# Patient Record
Sex: Male | Born: 1980
Health system: Southern US, Community
[De-identification: ages and names within clinical notes are randomized; demographics above are authoritative.]

## PROBLEM LIST (undated history)

## (undated) HISTORY — PX: KNEE ARTHROSCOPY: SUR90

## (undated) HISTORY — PX: SHOULDER ARTHROSCOPY: SHX128

---

## 2004-08-25 ENCOUNTER — Emergency Department (HOSPITAL_COMMUNITY): Admission: EM | Admit: 2004-08-25 | Discharge: 2004-08-25 | Payer: Self-pay | Admitting: Emergency Medicine

## 2007-08-13 ENCOUNTER — Encounter: Admission: RE | Admit: 2007-08-13 | Discharge: 2007-08-13 | Payer: Self-pay | Admitting: Family Medicine

## 2007-09-03 ENCOUNTER — Ambulatory Visit (HOSPITAL_COMMUNITY): Admission: RE | Admit: 2007-09-03 | Discharge: 2007-09-03 | Payer: Self-pay | Admitting: Gastroenterology

## 2009-06-15 ENCOUNTER — Ambulatory Visit (HOSPITAL_COMMUNITY): Admission: RE | Admit: 2009-06-15 | Discharge: 2009-06-15 | Payer: Self-pay | Admitting: Gastroenterology

## 2010-09-27 NOTE — Op Note (Signed)
NAME:  Gabriel Winters, Gabriel Winters NO.:  [id number]   MEDICAL RECORD NO.:  [id number]          PATIENT TYPE:  AMB   LOCATION:  ENDO                         FACILITY:  [facility]   PHYSICIAN:  Graylin Shiver, M.D.   DATE OF BIRTH:  [birth year]   DATE OF PROCEDURE:  09/03/2007  DATE OF DISCHARGE:                               OPERATIVE REPORT   PROCEDURE:  Esophagogastroduodenoscopy with endoscopic balloon  dilatation of a Schatzki's ring.   INDICATIONS FOR PROCEDURE:  Dysphagia, abnormal barium swallow showing  Schatzki's ring.   Informed consent was obtained after explanation of the risks of  bleeding, infection and perforation.   PREMEDICATIONS:  Fentanyl 100 mcg IV; Versed 8 mg IV; Phenergan 12.5 mg  IV.   PROCEDURE:  With the patient in the left lateral decubitus position, the  Pentax gastroscope was inserted into the oropharynx and passed into the  esophagus.  It was advanced down the esophagus to the distal esophagus  where a Schatzki's ring was identified.  The scope was advanced into the  stomach and then into the duodenum.  The duodenal bulb looked normal.  The stomach looked normal as far as the mucosa was concerned.  There was  a small hiatal hernia.  An endoscopic balloon dilator was advanced down  the scope and appropriately placed at the level of the Schatzki's ring.  The balloon was inflated to 15, then 16.5 and 18 mm and held in place at  each level for one minute.  The balloon was then deflated.  There was  some heme noted at the site of the dilation of Schatzki's ring.  This  was washed.  There was no significant bleeding.  The rest of the  esophagus looked normal.  He tolerated the procedure well without  complications.   IMPRESSION:  1. Schatzki's ring dilated to 18 mm.  2. Small hiatal hernia.   PLAN:  We will observe the response to the dilatation.           ______________________________  Graylin Shiver, M.D.     SFG/MEDQ  D:  09/03/2007  T:   09/03/2007  Job:  [id number]   cc:   Oley Balm. Georgina Pillion, M.D.  Fax: [phone]

## 2011-03-03 ENCOUNTER — Other Ambulatory Visit: Payer: Self-pay | Admitting: Family Medicine

## 2011-03-07 ENCOUNTER — Ambulatory Visit
Admission: RE | Admit: 2011-03-07 | Discharge: 2011-03-07 | Disposition: A | Discharge status: Home / Self Care | Payer: 59 | Source: Ambulatory Visit | Attending: Family Medicine | Admitting: Family Medicine

## 2012-10-30 ENCOUNTER — Ambulatory Visit
Admission: RE | Admit: 2012-10-30 | Discharge: 2012-10-30 | Disposition: A | Discharge status: Home / Self Care | Payer: 59 | Source: Ambulatory Visit

## 2012-10-30 ENCOUNTER — Other Ambulatory Visit: Payer: Self-pay

## 2012-10-30 DIAGNOSIS — M2550 Pain in unspecified joint: Secondary | ICD-10-CM

## 2012-12-20 ENCOUNTER — Other Ambulatory Visit: Payer: Self-pay | Admitting: Internal Medicine

## 2012-12-20 DIAGNOSIS — E230 Hypopituitarism: Secondary | ICD-10-CM

## 2012-12-23 ENCOUNTER — Ambulatory Visit
Admission: RE | Admit: 2012-12-23 | Discharge: 2012-12-23 | Disposition: A | Discharge status: Home / Self Care | Payer: 59 | Source: Ambulatory Visit | Attending: Internal Medicine | Admitting: Internal Medicine

## 2012-12-23 DIAGNOSIS — E230 Hypopituitarism: Secondary | ICD-10-CM

## 2012-12-23 MED ORDER — GADOBENATE DIMEGLUMINE 529 MG/ML IV SOLN
10.0000 mL | Freq: Once | INTRAVENOUS | Status: AC | PRN
  Administered 2012-12-23: 10 mL via INTRAVENOUS

## 2012-12-25 ENCOUNTER — Other Ambulatory Visit: Payer: 59

## 2016-07-18 DIAGNOSIS — E291 Testicular hypofunction: Secondary | ICD-10-CM | POA: Diagnosis not present

## 2016-10-18 DIAGNOSIS — E291 Testicular hypofunction: Secondary | ICD-10-CM | POA: Diagnosis not present

## 2017-01-22 DIAGNOSIS — Z13220 Encounter for screening for lipoid disorders: Secondary | ICD-10-CM | POA: Diagnosis not present

## 2017-01-22 DIAGNOSIS — E291 Testicular hypofunction: Secondary | ICD-10-CM | POA: Diagnosis not present

## 2017-08-03 DIAGNOSIS — E291 Testicular hypofunction: Secondary | ICD-10-CM | POA: Diagnosis not present

## 2017-12-19 DIAGNOSIS — R7989 Other specified abnormal findings of blood chemistry: Secondary | ICD-10-CM | POA: Diagnosis not present

## 2017-12-31 ENCOUNTER — Ambulatory Visit (HOSPITAL_COMMUNITY)
Admission: EM | Admit: 2017-12-31 | Discharge: 2017-12-31 | Disposition: A | Discharge status: Home / Self Care | Payer: 59 | Attending: Emergency Medicine | Admitting: Emergency Medicine

## 2017-12-31 ENCOUNTER — Encounter (HOSPITAL_COMMUNITY): Payer: Self-pay

## 2017-12-31 ENCOUNTER — Ambulatory Visit (INDEPENDENT_AMBULATORY_CARE_PROVIDER_SITE_OTHER): Payer: 59

## 2017-12-31 DIAGNOSIS — W208XXA Other cause of strike by thrown, projected or falling object, initial encounter: Secondary | ICD-10-CM | POA: Diagnosis not present

## 2017-12-31 DIAGNOSIS — S92535A Nondisplaced fracture of distal phalanx of left lesser toe(s), initial encounter for closed fracture: Secondary | ICD-10-CM | POA: Diagnosis not present

## 2017-12-31 DIAGNOSIS — S92422A Displaced fracture of distal phalanx of left great toe, initial encounter for closed fracture: Secondary | ICD-10-CM | POA: Diagnosis not present

## 2017-12-31 NOTE — ED Provider Notes (Signed)
[facility]    CSN: [id number] Arrival date & time: 12/31/17  [id number]     History   Chief Complaint Chief Complaint  Patient presents with  . Foot Pain    Left Foot    HPI Gabriel Winters is a 37 y.o. male.   Gabriel Winters presents with complaints of left foot pain after he accidentally dropped a 45lb weight plate on his left foot this morning. He was wearing sneakers at the gym. Had immediate pain. Some numbness, tingling has resolved. Numbness to bottom of toes/foot. Swelling has improved, he applied ice after incident. Denies  Any previous injury to toes or foot. He is a IT sales professionalfirefighter. Took motrin this morning which did help. Pain 6/10, worse with movement. Pain primarily to middle, ring, and pinky toe. Without contributing medical history.     ROS per HPI.      History reviewed. No pertinent past medical history.  There are no active problems to display for this patient.   Past Surgical History:  Procedure Laterality Date  . KNEE ARTHROSCOPY    . SHOULDER ARTHROSCOPY         Home Medications    Prior to Admission medications   Not on File    Family History History reviewed. No pertinent family history.  Social History Social History   Tobacco Use  . Smoking status: Never Smoker  . Smokeless tobacco: Never Used  Substance Use Topics  . Alcohol use: Yes  . Drug use: Not on file     Allergies   Patient has no known allergies.   Review of Systems Review of Systems   Physical Exam Triage Vital Signs ED Triage Vitals  Enc Vitals Group     BP 12/31/17 0819 136/80     Pulse Rate 12/31/17 0819 (!) 58     Resp 12/31/17 0819 16     Temp 12/31/17 0819 98 F (36.7 C)     Temp Source 12/31/17 0819 Oral     SpO2 12/31/17 0819 98 %     Weight --      Height --      Head Circumference --      Peak Flow --      Pain Score 12/31/17 0822 6     Pain Loc --      Pain Edu? --      Excl. in GC? --    No data found.  Updated Vital Signs BP  136/80 (BP Location: Left Arm)   Pulse (!) 58   Temp 98 F (36.7 C) (Oral)   Resp 16   SpO2 98%    Physical Exam  Constitutional: He is oriented to person, place, and time. He appears well-developed and well-nourished.  Cardiovascular: Normal rate and regular rhythm.  Pulmonary/Chest: Effort normal and breath sounds normal.  Musculoskeletal:       Left ankle: Normal.       Left foot: There is decreased range of motion, tenderness, bony tenderness and swelling. There is normal capillary refill, no crepitus, no deformity and no laceration.       Feet:  Tenderness to distal and proximal third, fourth and fifth toes; sensation intact; cap refill < 2 seconds; pain with ROM to these toes; bruising noted and slight swelling; no pain past MCP joints   Neurological: He is alert and oriented to person, place, and time.  Skin: Skin is warm and dry.     UC Treatments / Results  Labs (all labs ordered  are listed, but only abnormal results are displayed) Labs Reviewed - No data to display  EKG None  Radiology Dg Foot Complete Left  Result Date: 12/31/2017 CLINICAL DATA:  Dropped dumbbell on foot with pain and swelling, initial encounter EXAM: LEFT FOOT - COMPLETE 3+ VIEW COMPARISON:  10/30/2012 FINDINGS: Undisplaced fractures involving the third fourth and fifth distal phalanges are seen. No other focal abnormality is noted. IMPRESSION: Distal phalangeal fractures as described above. Electronically Signed   By: Alcide CleverMark  Lukens M.D.   On: 12/31/2017 08:43    Procedures Procedures (including critical care time)  Medications Ordered in UC Medications - No data to display  Initial Impression / Assessment and Plan / UC Course  I have reviewed the triage vital signs and the nursing notes.  Pertinent labs & imaging results that were available during my care of the patient were reviewed by me and considered in my medical decision making (see chart for details).     Distal phalanx fractures  noted on xray to left 3-5th toes. 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 Ice, elevation, ibuprofen for pain control. To follow up with ortho and/or his occupational physician for work clearance. Patient verbalized understanding and agreeable to plan.  Ambulatory out of clinic without difficulty.    Final Clinical Impressions(s) / UC Diagnoses   Final diagnoses:  Closed nondisplaced fracture of distal phalanx of lesser toe of left foot, initial encounter     Discharge Instructions     Light duty until cleared by orthopedics or your occupational physician. Ice, elevation, Ibuprofen for pain control     Dg Foot Complete Left  Result Date: 12/31/2017 CLINICAL DATA:  Dropped dumbbell on foot with pain and swelling, initial encounter EXAM: LEFT FOOT - COMPLETE 3+ VIEW COMPARISON:  10/30/2012 FINDINGS: Undisplaced fractures involving the third fourth and fifth distal phalanges are seen. No other focal abnormality is noted. IMPRESSION: Distal phalangeal fractures as described above. Electronically Signed   By: Alcide CleverMark  Lukens M.D.   On: 12/31/2017 08:43      ED Prescriptions    None     Controlled Substance Prescriptions [place] Controlled Substance Registry consulted? Not Applicable   Georgetta HaberBurky, Weronika Birch B, NP 12/31/17 [phone]

## 2017-12-31 NOTE — ED Triage Notes (Signed)
Pt complains of left foot pain after dropping a 45 lb weight on it at the gym.

## 2017-12-31 NOTE — Discharge Instructions (Addendum)
Light duty until cleared by orthopedics or your occupational physician. Ice, elevation, Ibuprofen for pain control     Dg Foot Complete Left  Result Date: 12/31/2017 CLINICAL DATA:  Dropped dumbbell on foot with pain and swelling, initial encounter EXAM: LEFT FOOT - COMPLETE 3+ VIEW COMPARISON:  10/30/2012 FINDINGS: Undisplaced fractures involving the third fourth and fifth distal phalanges are seen. No other focal abnormality is noted. IMPRESSION: Distal phalangeal fractures as described above. Electronically Signed   By: Alcide CleverMark  Lukens M.D.   On: 12/31/2017 08:43

## 2018-01-02 DIAGNOSIS — E291 Testicular hypofunction: Secondary | ICD-10-CM | POA: Diagnosis not present

## 2018-04-05 DIAGNOSIS — E291 Testicular hypofunction: Secondary | ICD-10-CM | POA: Diagnosis not present

## 2018-10-08 DIAGNOSIS — E291 Testicular hypofunction: Secondary | ICD-10-CM | POA: Diagnosis not present

## 2020-02-18 ENCOUNTER — Other Ambulatory Visit: Payer: 59

## 2020-02-18 ENCOUNTER — Other Ambulatory Visit: Payer: Self-pay

## 2020-02-18 DIAGNOSIS — Z20822 Contact with and (suspected) exposure to covid-19: Secondary | ICD-10-CM

## 2020-02-20 LAB — NOVEL CORONAVIRUS, NAA: SARS-CoV-2, NAA: NOT DETECTED

## 2020-02-20 LAB — SARS-COV-2, NAA 2 DAY TAT

## 2020-03-26 IMAGING — DX DG FOOT COMPLETE 3+V*L*
3 series · 3 of 3 positions shown · non-contrast
Comparison: 10/30/2012

CLINICAL DATA: Dropped dumbbell on foot with pain and swelling,
initial encounter

EXAM:
LEFT FOOT - COMPLETE 3+ VIEW

[foot ap]
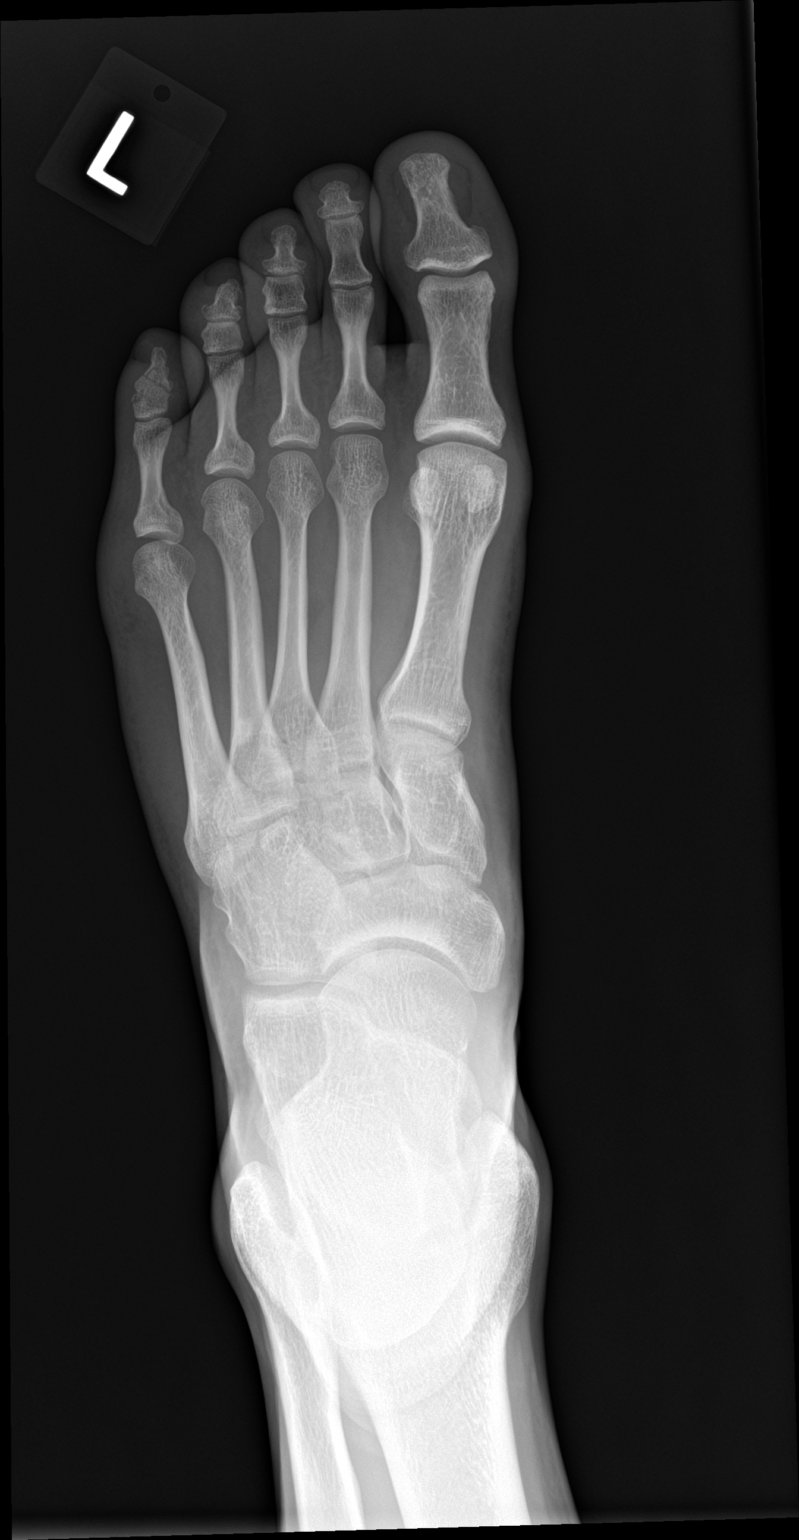

[foot obl]
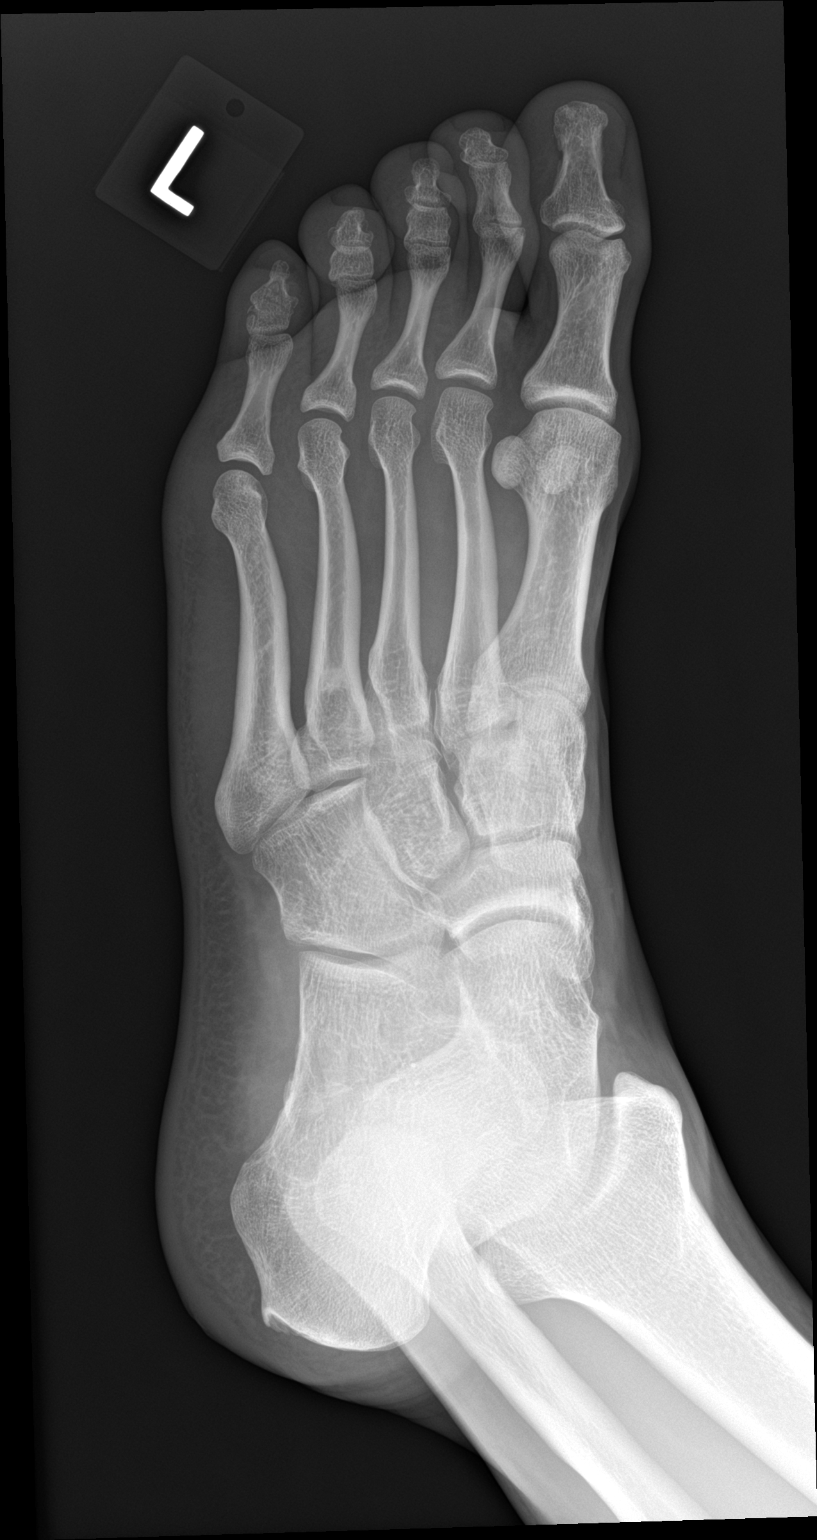

[foot lat]
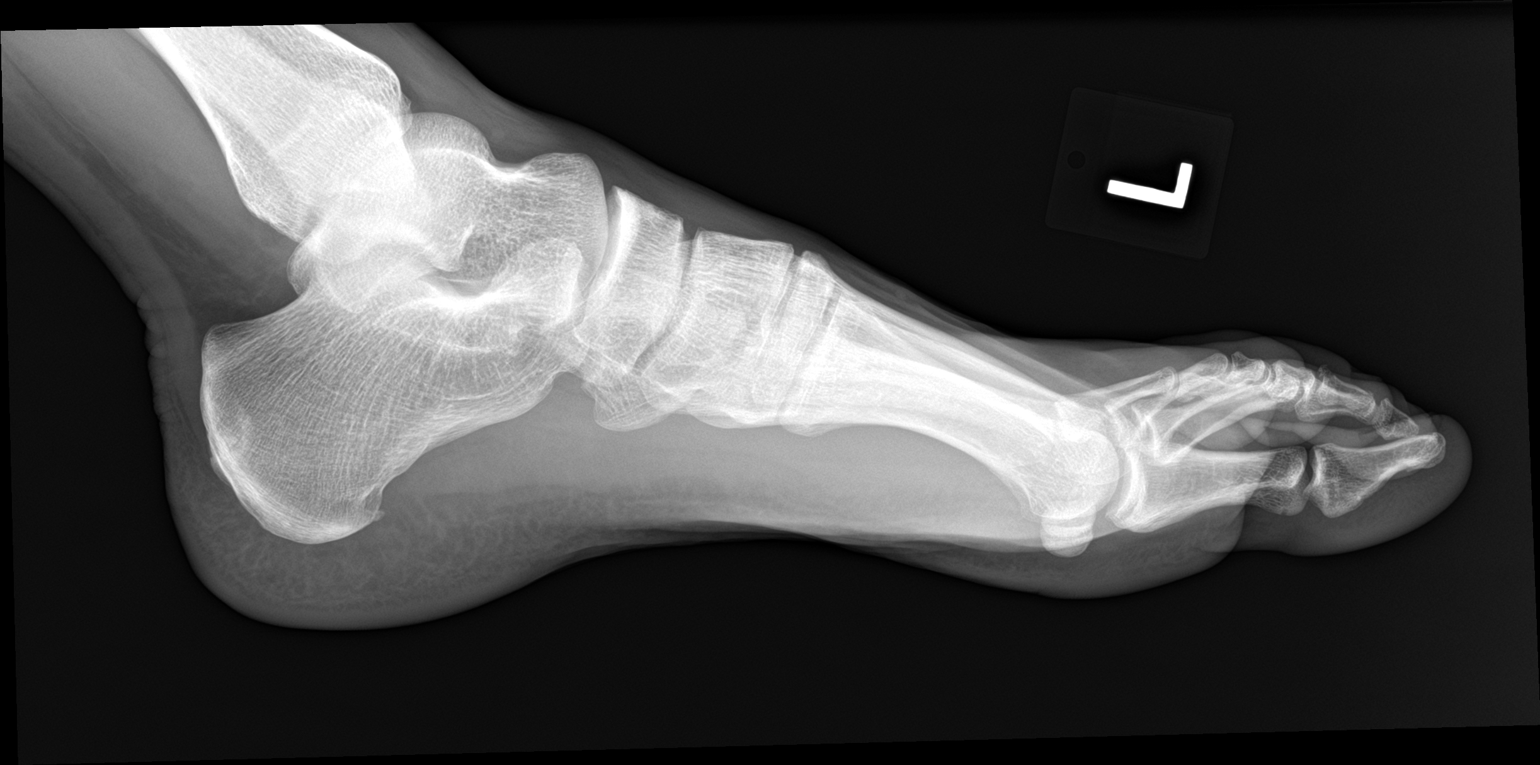

[3 of 3 positions shown; findings below may reference images not displayed]

FINDINGS: Undisplaced fractures involving the third fourth and fifth distal
phalanges are seen. No other focal abnormality is noted.
IMPRESSION: Distal phalangeal fractures as described above.

## 2021-12-26 DIAGNOSIS — F411 Generalized anxiety disorder: Secondary | ICD-10-CM | POA: Diagnosis not present

## 2021-12-26 DIAGNOSIS — R69 Illness, unspecified: Secondary | ICD-10-CM | POA: Diagnosis not present

## 2021-12-26 DIAGNOSIS — R030 Elevated blood-pressure reading, without diagnosis of hypertension: Secondary | ICD-10-CM | POA: Diagnosis not present

## 2021-12-26 DIAGNOSIS — E291 Testicular hypofunction: Secondary | ICD-10-CM | POA: Diagnosis not present

## 2022-02-02 DIAGNOSIS — R1310 Dysphagia, unspecified: Secondary | ICD-10-CM | POA: Diagnosis not present

## 2022-02-02 DIAGNOSIS — K200 Eosinophilic esophagitis: Secondary | ICD-10-CM | POA: Diagnosis not present

## 2022-02-02 DIAGNOSIS — K2100 Gastro-esophageal reflux disease with esophagitis, without bleeding: Secondary | ICD-10-CM | POA: Diagnosis not present

## 2022-03-09 DIAGNOSIS — R69 Illness, unspecified: Secondary | ICD-10-CM | POA: Diagnosis not present

## 2022-03-15 DIAGNOSIS — R69 Illness, unspecified: Secondary | ICD-10-CM | POA: Diagnosis not present

## 2022-03-30 DIAGNOSIS — R69 Illness, unspecified: Secondary | ICD-10-CM | POA: Diagnosis not present

## 2022-04-13 DIAGNOSIS — R69 Illness, unspecified: Secondary | ICD-10-CM | POA: Diagnosis not present

## 2022-04-19 DIAGNOSIS — R69 Illness, unspecified: Secondary | ICD-10-CM | POA: Diagnosis not present

## 2022-05-30 DIAGNOSIS — Z03818 Encounter for observation for suspected exposure to other biological agents ruled out: Secondary | ICD-10-CM | POA: Diagnosis not present

## 2022-05-30 DIAGNOSIS — J40 Bronchitis, not specified as acute or chronic: Secondary | ICD-10-CM | POA: Diagnosis not present

## 2022-05-30 DIAGNOSIS — J988 Other specified respiratory disorders: Secondary | ICD-10-CM | POA: Diagnosis not present

## 2022-05-30 DIAGNOSIS — R69 Illness, unspecified: Secondary | ICD-10-CM | POA: Diagnosis not present

## 2022-06-20 DIAGNOSIS — R69 Illness, unspecified: Secondary | ICD-10-CM | POA: Diagnosis not present

## 2022-06-29 DIAGNOSIS — F4322 Adjustment disorder with anxiety: Secondary | ICD-10-CM | POA: Diagnosis not present

## 2022-07-04 DIAGNOSIS — F411 Generalized anxiety disorder: Secondary | ICD-10-CM | POA: Diagnosis not present

## 2022-07-11 DIAGNOSIS — F4322 Adjustment disorder with anxiety: Secondary | ICD-10-CM | POA: Diagnosis not present

## 2022-07-20 DIAGNOSIS — F900 Attention-deficit hyperactivity disorder, predominantly inattentive type: Secondary | ICD-10-CM | POA: Diagnosis not present

## 2022-07-20 DIAGNOSIS — I10 Essential (primary) hypertension: Secondary | ICD-10-CM | POA: Diagnosis not present

## 2022-07-20 DIAGNOSIS — G479 Sleep disorder, unspecified: Secondary | ICD-10-CM | POA: Diagnosis not present

## 2022-08-17 DIAGNOSIS — G4719 Other hypersomnia: Secondary | ICD-10-CM | POA: Diagnosis not present

## 2022-08-17 DIAGNOSIS — E785 Hyperlipidemia, unspecified: Secondary | ICD-10-CM | POA: Diagnosis not present

## 2022-09-04 DIAGNOSIS — F4322 Adjustment disorder with anxiety: Secondary | ICD-10-CM | POA: Diagnosis not present

## 2022-09-19 DIAGNOSIS — F4322 Adjustment disorder with anxiety: Secondary | ICD-10-CM | POA: Diagnosis not present

## 2022-09-26 DIAGNOSIS — F4322 Adjustment disorder with anxiety: Secondary | ICD-10-CM | POA: Diagnosis not present

## 2022-09-28 DIAGNOSIS — G4733 Obstructive sleep apnea (adult) (pediatric): Secondary | ICD-10-CM | POA: Diagnosis not present

## 2022-09-28 DIAGNOSIS — E785 Hyperlipidemia, unspecified: Secondary | ICD-10-CM | POA: Diagnosis not present

## 2022-09-28 DIAGNOSIS — F411 Generalized anxiety disorder: Secondary | ICD-10-CM | POA: Diagnosis not present

## 2022-10-12 DIAGNOSIS — G4733 Obstructive sleep apnea (adult) (pediatric): Secondary | ICD-10-CM | POA: Diagnosis not present

## 2022-10-27 DIAGNOSIS — G4733 Obstructive sleep apnea (adult) (pediatric): Secondary | ICD-10-CM | POA: Diagnosis not present

## 2022-10-31 DIAGNOSIS — F4322 Adjustment disorder with anxiety: Secondary | ICD-10-CM | POA: Diagnosis not present

## 2022-11-07 DIAGNOSIS — F4322 Adjustment disorder with anxiety: Secondary | ICD-10-CM | POA: Diagnosis not present

## 2022-11-09 DIAGNOSIS — F411 Generalized anxiety disorder: Secondary | ICD-10-CM | POA: Diagnosis not present

## 2022-11-12 DIAGNOSIS — G4733 Obstructive sleep apnea (adult) (pediatric): Secondary | ICD-10-CM | POA: Diagnosis not present

## 2022-12-12 DIAGNOSIS — G4733 Obstructive sleep apnea (adult) (pediatric): Secondary | ICD-10-CM | POA: Diagnosis not present

## 2022-12-26 DIAGNOSIS — F4322 Adjustment disorder with anxiety: Secondary | ICD-10-CM | POA: Diagnosis not present

## 2022-12-28 DIAGNOSIS — G4733 Obstructive sleep apnea (adult) (pediatric): Secondary | ICD-10-CM | POA: Diagnosis not present

## 2022-12-30 ENCOUNTER — Emergency Department (HOSPITAL_BASED_OUTPATIENT_CLINIC_OR_DEPARTMENT_OTHER): Payer: 59

## 2022-12-30 ENCOUNTER — Emergency Department (HOSPITAL_BASED_OUTPATIENT_CLINIC_OR_DEPARTMENT_OTHER)
Admission: EM | Admit: 2022-12-30 | Discharge: 2022-12-30 | Disposition: A | Discharge status: Home / Self Care | Payer: 59 | Attending: Emergency Medicine | Admitting: Emergency Medicine

## 2022-12-30 ENCOUNTER — Other Ambulatory Visit: Payer: Self-pay

## 2022-12-30 ENCOUNTER — Encounter (HOSPITAL_BASED_OUTPATIENT_CLINIC_OR_DEPARTMENT_OTHER): Payer: Self-pay

## 2022-12-30 DIAGNOSIS — S6991XA Unspecified injury of right wrist, hand and finger(s), initial encounter: Secondary | ICD-10-CM | POA: Diagnosis not present

## 2022-12-30 DIAGNOSIS — Z23 Encounter for immunization: Secondary | ICD-10-CM | POA: Insufficient documentation

## 2022-12-30 DIAGNOSIS — S61411A Laceration without foreign body of right hand, initial encounter: Secondary | ICD-10-CM | POA: Diagnosis not present

## 2022-12-30 DIAGNOSIS — M7989 Other specified soft tissue disorders: Secondary | ICD-10-CM | POA: Insufficient documentation

## 2022-12-30 DIAGNOSIS — W19XXXA Unspecified fall, initial encounter: Secondary | ICD-10-CM | POA: Insufficient documentation

## 2022-12-30 DIAGNOSIS — S61431A Puncture wound without foreign body of right hand, initial encounter: Secondary | ICD-10-CM | POA: Diagnosis not present

## 2022-12-30 DIAGNOSIS — S63290A Dislocation of distal interphalangeal joint of right index finger, initial encounter: Secondary | ICD-10-CM | POA: Diagnosis not present

## 2022-12-30 DIAGNOSIS — S61239A Puncture wound without foreign body of unspecified finger without damage to nail, initial encounter: Secondary | ICD-10-CM

## 2022-12-30 DIAGNOSIS — S63280A Dislocation of proximal interphalangeal joint of right index finger, initial encounter: Secondary | ICD-10-CM | POA: Diagnosis not present

## 2022-12-30 DIAGNOSIS — S63250A Unspecified dislocation of right index finger, initial encounter: Secondary | ICD-10-CM | POA: Diagnosis not present

## 2022-12-30 MED ORDER — TETANUS-DIPHTH-ACELL PERTUSSIS 5-2.5-18.5 LF-MCG/0.5 IM SUSY
0.5000 mL | PREFILLED_SYRINGE | Freq: Once | INTRAMUSCULAR | Status: AC
  Administered 2022-12-30: 0.5 mL via INTRAMUSCULAR
  Filled 2022-12-30: qty 0.5

## 2022-12-30 NOTE — ED Provider Notes (Signed)
[facility] Provider Note   CSN: [id number] Arrival date & time: 12/30/22  1910     History  Chief Complaint  Patient presents with   Finger Injury    Gabriel Winters is a 42 y.o. male with no past medical history who presents to the ED complaining of right second finger pain.  [place] and [place] on his finger.  He is having difficulty with range of motion of the DIP joint.  Denies any head injury or other complaints today.  Last tetanus unknown.      Home Medications No daily medications  Allergies    Patient has no known allergies.    Review of Systems   Review of Systems  All other systems reviewed and are negative.   Physical Exam Updated Vital Signs BP (!) 175/92   Pulse 76   Temp 98.1 F (36.7 C) (Oral)   Resp 15   Ht 6' (1.829 m)   Wt 104.3 kg   SpO2 98%   BMI 31.19 kg/m  Physical Exam Vitals and nursing note reviewed.  Constitutional:      General: He is not in acute distress.    Appearance: Normal appearance.  HENT:     Head: Normocephalic and atraumatic.     Mouth/Throat:     Mouth: Mucous membranes are moist.  Eyes:     Conjunctiva/sclera: Conjunctivae normal.  Cardiovascular:     Rate and Rhythm: Normal rate and regular rhythm.  Pulmonary:     Effort: Pulmonary effort is normal.     Breath sounds: Normal breath sounds.  Abdominal:     General: Abdomen is flat.     Palpations: Abdomen is soft.  Musculoskeletal:     Cervical back: Neck supple.     Comments: Superficial abrasion to the palmar aspect of the distal right second finger, restricted range of motion at the DIP joint, full range of motion at the PIP joint, metacarpals, and remainder of hand, 2+ radial pulse, normal capillary refill, normal sensation, no active bleeding from wound, questionable dorsal dislocation  Skin:    General: Skin is warm and dry.     Capillary  Refill: Capillary refill takes less than 2 seconds.  Neurological:     Mental Status: He is alert. Mental status is at baseline.  Psychiatric:        Behavior: Behavior normal.     ED Results / Procedures / Treatments   Labs (all labs ordered are listed, but only abnormal results are displayed) Labs Reviewed - No data to display  EKG None  Radiology DG Hand Complete Right  Result Date: 12/30/2022 CLINICAL DATA:  Fall, right hand laceration, right hand pain EXAM: RIGHT HAND - COMPLETE 3+ VIEW COMPARISON:  None Available. FINDINGS: There is dorsal dislocation of the distal interphalangeal joint of the index finger. No associated fracture. Otherwise normal alignment. Soft tissues are unremarkable. IMPRESSION: 1. Dorsal dislocation of the distal interphalangeal joint of the index finger. Electronically Signed   By: Helyn Numbers M.D.   On: 12/30/2022 19:56    Procedures .Ortho Injury Treatment  Date/Time: 12/30/2022 8:00 PM  Performed by: Tonette Lederer, PA-C Authorized by: Tonette Lederer, PA-C   Consent:    Consent obtained:  Verbal   Consent given by:  Patient   Risks discussed:  Fracture, irreducible dislocation, recurrent dislocation, stiffness, restricted joint movement and nerve  damage   Alternatives discussed:  No treatment, alternative treatment, immobilization, referral and delayed treatmentInjury location: Right index finger. Pre-procedure neurovascular assessment: neurovascularly intact Pre-procedure distal perfusion: normal Pre-procedure neurological function: normal Pre-procedure range of motion: reduced  Anesthesia: Local anesthesia used: no  Patient sedated: NoImmobilization: splint Splint type: static finger Splint Applied by: ED Nurse Supplies used: aluminum splint Post-procedure neurovascular assessment: post-procedure neurovascularly intact Post-procedure distal perfusion: normal Post-procedure neurological function: normal Post-procedure range of  motion: normal       Medications Ordered in ED Medications  Tdap (BOOSTRIX) injection 0.5 mL (0.5 mLs Intramuscular Given 12/30/22 1941)    ED Course/ Medical Decision Making/ A&P                                 Medical Decision Making Amount and/or Complexity of Data Reviewed Radiology: ordered. Decision-making details documented in ED Course.  Risk Prescription drug management.   Medical Decision Making:   Gabriel Winters is a 42 y.o. male who presented to the ED today with finger injury detailed above.     Complete initial physical exam performed, notably the patient was in NAD. Superficial abrasion to the palmar aspect of the tip of the right second finger, restricted range of motion at the DIP joint, full range of motion at the PIP joint, metacarpals, and remainder of hand, 2+ radial pulse, normal capillary refill, normal sensation, no active bleeding from wound, no obvious deformity.    Reviewed and confirmed nursing documentation for past medical history, family history, social history.    Initial Assessment:   With the patient's presentation, differential diagnosis includes but is not limited to sprain, strain, fracture, dislocation, contusion, abrasion, laceration. This is most consistent with an acute complicated illness  Initial Plan:  X-rays to evaluate for bony pathology Objective evaluation as below reviewed   Initial Study Results:   Radiology:  All images reviewed independently. Agree with radiology report at this time.   DG Hand Complete Right  Result Date: 12/30/2022 CLINICAL DATA:  Fall, right hand laceration, right hand pain EXAM: RIGHT HAND - COMPLETE 3+ VIEW COMPARISON:  None Available. FINDINGS: There is dorsal dislocation of the distal interphalangeal joint of the index finger. No associated fracture. Otherwise normal alignment. Soft tissues are unremarkable. IMPRESSION: 1. Dorsal dislocation of the distal interphalangeal joint of the index finger.  Electronically Signed   By: Helyn Numbers M.D.   On: 12/30/2022 19:56      Final Assessment and Plan:   42 year old male presents to the ED with right pointer finger injury.  Questionable dislocation at DIP joint.  Neurovascularly intact distally.  Remainder of hand with full range of motion without obvious deformity.  Small abrasion to the tip of the finger that was affected.  No active bleeding.  No repairable wounds.  Wound was irrigated thoroughly.  Discussed proper wound care with patient.  No history of diabetes, underlying immune disorder.  Low risk for infection.  X-ray shows a dorsal dislocation at the DIP joint.  Dislocation was successfully reduced.  Discussed option of repeating x-ray to confirm reduction but patient feels comfortable with reduction and has resulted at full range of motion and resolution of pain so he will forego this at this time.  Will place in finger splint for the next few days for comfort.  Provided with follow-up with hand surgery for any concerns or complications as well as strict ED return precautions.  All questions answered and stable for discharge.   Clinical Impression:  1. Closed traumatic dislocation of distal interphalangeal (DIP) joint of right index finger   2. Puncture wound of finger of right hand, initial encounter      Discharge           Final Clinical Impression(s) / ED Diagnoses Final diagnoses:  Closed traumatic dislocation of distal interphalangeal (DIP) joint of right index finger  Puncture wound of finger of right hand, initial encounter    Rx / DC Orders ED Discharge Orders     None         Richardson Dopp 12/30/22 2016    Lonell Grandchild, MD 12/30/22 2112

## 2022-12-30 NOTE — ED Notes (Signed)
Wound care provided- irrigated and soaking in NS.

## 2022-12-30 NOTE — Discharge Instructions (Addendum)
Your finger was dislocated.  We were able to successfully put this back in place.  We placed you in a splint for comfort.  Use for comfort for 2 to 3 days.  I suspect it will be sore but it should not be extremely painful.  You may take over-the-counter medication as needed for pain.  Be sure to keep the wound to your finger clean and dry. I recommend bandaging this with a thin layer of antibiotic ointment until it heals as hands do become easily infected.  I provided you with follow-up with a hand specialist as needed should you have any complications with movement of your finger or signs of infection. If you develop new injury or concerns such as fever, redness/swelling/increased pain of the finger, streaking redness of the arm, numbness or tingling, or other new concerns, return to nearest ED for re-evaluation.

## 2022-12-30 NOTE — ED Triage Notes (Signed)
Pt had mechanical fall and is now unable to bend R pointer finger. Open area to fingertip.

## 2023-01-12 DIAGNOSIS — G4733 Obstructive sleep apnea (adult) (pediatric): Secondary | ICD-10-CM | POA: Diagnosis not present

## 2023-02-12 DIAGNOSIS — G4733 Obstructive sleep apnea (adult) (pediatric): Secondary | ICD-10-CM | POA: Diagnosis not present

## 2023-03-14 DIAGNOSIS — G4733 Obstructive sleep apnea (adult) (pediatric): Secondary | ICD-10-CM | POA: Diagnosis not present

## 2023-04-10 DIAGNOSIS — F4322 Adjustment disorder with anxiety: Secondary | ICD-10-CM | POA: Diagnosis not present

## 2023-04-14 DIAGNOSIS — G4733 Obstructive sleep apnea (adult) (pediatric): Secondary | ICD-10-CM | POA: Diagnosis not present

## 2023-04-16 DIAGNOSIS — F4322 Adjustment disorder with anxiety: Secondary | ICD-10-CM | POA: Diagnosis not present

## 2023-05-03 DIAGNOSIS — F4322 Adjustment disorder with anxiety: Secondary | ICD-10-CM | POA: Diagnosis not present

## 2023-05-07 DIAGNOSIS — F4322 Adjustment disorder with anxiety: Secondary | ICD-10-CM | POA: Diagnosis not present

## 2023-05-14 DIAGNOSIS — G4733 Obstructive sleep apnea (adult) (pediatric): Secondary | ICD-10-CM | POA: Diagnosis not present
